# Patient Record
Sex: Female | Born: 2000
Health system: Southern US, Community
[De-identification: ages and names within clinical notes are randomized; demographics above are authoritative.]

## PROBLEM LIST (undated history)

## (undated) HISTORY — PX: DILATION AND CURETTAGE OF UTERUS: SHX78

---

## 2019-09-15 ENCOUNTER — Emergency Department (HOSPITAL_BASED_OUTPATIENT_CLINIC_OR_DEPARTMENT_OTHER)
Admission: EM | Admit: 2019-09-15 | Discharge: 2019-09-15 | Disposition: A | Discharge status: Home / Self Care | Payer: 59 | Attending: Emergency Medicine | Admitting: Emergency Medicine

## 2019-09-15 ENCOUNTER — Emergency Department (HOSPITAL_BASED_OUTPATIENT_CLINIC_OR_DEPARTMENT_OTHER): Payer: 59

## 2019-09-15 ENCOUNTER — Other Ambulatory Visit: Payer: Self-pay

## 2019-09-15 ENCOUNTER — Encounter (HOSPITAL_BASED_OUTPATIENT_CLINIC_OR_DEPARTMENT_OTHER): Payer: Self-pay

## 2019-09-15 DIAGNOSIS — U071 COVID-19: Secondary | ICD-10-CM

## 2019-09-15 DIAGNOSIS — R072 Precordial pain: Secondary | ICD-10-CM | POA: Diagnosis present

## 2019-09-15 LAB — COMPREHENSIVE METABOLIC PANEL
ALT: 11 U/L (ref 0–44)
AST: 20 U/L (ref 15–41)
Albumin: 4 g/dL (ref 3.5–5.0)
Alkaline Phosphatase: 51 U/L (ref 38–126)
Anion gap: 9 (ref 5–15)
BUN: 8 mg/dL (ref 6–20)
CO2: 24 mmol/L (ref 22–32)
Calcium: 8.8 mg/dL — ABNORMAL LOW (ref 8.9–10.3)
Chloride: 105 mmol/L (ref 98–111)
Creatinine, Ser: 0.88 mg/dL (ref 0.44–1.00)
GFR calc Af Amer: >60 mL/min (ref >60)
GFR calc non Af Amer: >60 mL/min (ref >60)
Glucose, Bld: 80 mg/dL (ref 70–99)
Potassium: 3.7 mmol/L (ref 3.5–5.1)
Sodium: 138 mmol/L (ref 135–145)
Total Bilirubin: 0.5 mg/dL (ref 0.3–1.2)
Total Protein: 7.7 g/dL (ref 6.5–8.1)

## 2019-09-15 LAB — CBC
HCT: 41.8 % (ref 36.0–46.0)
Hemoglobin: 13.6 g/dL (ref 12.0–15.0)
MCH: 32.2 pg (ref 26.0–34.0)
MCHC: 32.5 g/dL (ref 30.0–36.0)
MCV: 98.8 fL (ref 80.0–100.0)
Platelets: 223 10*3/uL (ref 150–400)
RBC: 4.23 MIL/uL (ref 3.87–5.11)
RDW: 12 % (ref 11.5–15.5)
WBC: 4.3 10*3/uL (ref 4.0–10.5)
nRBC: 0 % (ref 0.0–0.2)

## 2019-09-15 LAB — LIPASE, BLOOD: Lipase: 29 U/L (ref 11–51)

## 2019-09-15 MED ORDER — DEXAMETHASONE SODIUM PHOSPHATE 10 MG/ML IJ SOLN
10.0000 mg | Freq: Once | INTRAMUSCULAR | Status: AC
  Administered 2019-09-15: 12:00:00 10 mg via INTRAVENOUS
  Filled 2019-09-15: qty 1

## 2019-09-15 MED ORDER — METHYLPREDNISOLONE 4 MG PO TBPK: ORAL_TABLET | ORAL | 0 refills | Status: DC

## 2019-09-15 MED ORDER — ONDANSETRON HCL 4 MG/2ML IJ SOLN
4.0000 mg | Freq: Once | INTRAMUSCULAR | Status: AC
  Administered 2019-09-15: 12:00:00 4 mg via INTRAVENOUS
  Filled 2019-09-15: qty 2

## 2019-09-15 MED ORDER — SODIUM CHLORIDE 0.9 % IV BOLUS
500.0000 mL | Freq: Once | INTRAVENOUS | Status: AC
  Administered 2019-09-15: 500 mL via INTRAVENOUS

## 2019-09-15 MED ORDER — ACETAMINOPHEN 325 MG PO TABS
650.0000 mg | ORAL_TABLET | Freq: Once | ORAL | Status: AC
  Administered 2019-09-15: 12:00:00 650 mg via ORAL
  Filled 2019-09-15: qty 2

## 2019-09-15 MED FILL — METHYLPREDNISOLONE 4 MG TBP: 4 | 6 days supply | Qty: 21 | Fill #0

## 2019-09-15 NOTE — ED Notes (Signed)
Pt reports taking 650 mg Tylenol around 630 this morning.

## 2019-09-15 NOTE — ED Provider Notes (Signed)
[facility] EMERGENCY DEPARTMENT Provider Note   CSN: [id number] Arrival date & time: 09/15/19  9562     History Chief Complaint  Patient presents with  . Chest Pain    Kaityln Kallstrom is a 19 y.o. female who presents emergency department with chief complaint of chest pain.  She is Covid positive.  Onset of symptoms 2 nights ago.  She tested positive yesterday.  She has had associated nausea, body aches, chills, cough and fevers.  She has been taking Tylenol and naproxen which she took early this morning around 6 AM.  She denies vomiting.  This morning she began having pain which is retrosternal, worse with deep breathing and coughing.  She denies unilateral leg swelling, history of PE or DVT.  She is not on oral contraceptive pills.  HPI     History reviewed. No pertinent past medical history.  There are no problems to display for this patient.   History reviewed. No pertinent surgical history.   OB History   No obstetric history on file.     No family history on file.  Social History   Tobacco Use  . Smoking status: Never Smoker  . Smokeless tobacco: Never Used  Substance Use Topics  . Alcohol use: Never  . Drug use: Never    Home Medications Prior to Admission medications   Not on File    Allergies    Shellfish allergy  Review of Systems   Review of Systems Ten systems reviewed and are negative for acute change, except as noted in the HPI.   Physical Exam Updated Vital Signs BP 124/62 (BP Location: Right Arm)   Pulse 94   Temp (!) 101 F (38.3 C) (Oral)   Resp 18   Ht 5\' 7"  (1.702 m)   Wt 59 kg   LMP 09/08/2019   SpO2 100%   BMI 20.36 kg/m   Physical Exam Vitals and nursing note reviewed.  Constitutional:      General: She is not in acute distress.    Appearance: She is well-developed. She is ill-appearing. She is not toxic-appearing or diaphoretic.  HENT:     Head: Normocephalic and atraumatic.  Eyes:     General: No scleral  icterus.    Conjunctiva/sclera: Conjunctivae normal.  Cardiovascular:     Rate and Rhythm: Regular rhythm. Tachycardia present.     Heart sounds: Normal heart sounds. No murmur. No friction rub. No gallop.   Pulmonary:     Effort: Pulmonary effort is normal. No respiratory distress.     Breath sounds: Normal breath sounds.  Abdominal:     General: Bowel sounds are normal. There is no distension.     Palpations: Abdomen is soft. There is no mass.     Tenderness: There is no abdominal tenderness. There is no guarding.  Musculoskeletal:     Cervical back: Normal range of motion.  Skin:    General: Skin is warm and dry.  Neurological:     Mental Status: She is alert and oriented to person, place, and time.  Psychiatric:        Behavior: Behavior normal.     ED Results / Procedures / Treatments   Labs (all labs ordered are listed, but only abnormal results are displayed) Labs Reviewed  CBC  COMPREHENSIVE METABOLIC PANEL  URINALYSIS, ROUTINE W REFLEX MICROSCOPIC  PREGNANCY, URINE  LIPASE, BLOOD    EKG EKG Interpretation  Date/Time:  Tuesday September 15 2019 10:24:52 EDT Ventricular Rate:  104  PR Interval:    QRS Duration: 66 QT Interval:  296 QTC Calculation: 390 R Axis:   77 Text Interpretation: Sinus tachycardia No previous tracing Confirmed by Blanchie Dessert [phone]) on 09/15/2019 10:51:13 AM   Radiology No results found.  Procedures Procedures (including critical care time)  Medications Ordered in ED Medications  ondansetron (ZOFRAN) injection 4 mg (has no administration in time range)  acetaminophen (TYLENOL) tablet 650 mg (has no administration in time range)  dexamethasone (DECADRON) injection 10 mg (has no administration in time range)  sodium chloride 0.9 % bolus 500 mL (has no administration in time range)    ED Course  I have reviewed the triage vital signs and the nursing notes.  Pertinent labs & imaging results that were available during my care of  the patient were reviewed by me and considered in my medical decision making (see chart for details).  Clinical Course as of Sep 15 1827  Tue Sep 15, 2019  1254 Chest pain resolved.    [AH]    Clinical Course User Index [AH] Margarita Mail, PA-C   MDM Rules/Calculators/A&P                      19 year old female with diagnosis of coronavirus. She presented febrile and tachycardic with complaint of chest pain. Did not feel that a D-dimer would be an appropriate diagnostic agent with concern for pulmonary embolus given the fact that the majority of these types of inflammatory markers are elevated in Covid positive patients. I opted to treat the patient with fluid, Decadron, and Tylenol. The patient's vital signs have improved significantly. Her chest pain has resolved. She still has some mild body aches. I personally ordered and reviewed the patient's labs which show normal lipase, CBC without abnormality. CMP shows a slightly low calcium of insignificant value. I also ordered interpreted and reviewed the images of the patient's 1 view chest x-ray which shows no evidence of diffuse patchy infiltrate or signs of Covid related pneumonia. I suspect the patient's symptoms are related to her viral infection. I have very low suspicion for acute pulmonary embolus especially since the patient's pain has completely resolved. Patient appears otherwise appropriate for discharge at this time. I discussed return precautions. Final Clinical Impression(s) / ED Diagnoses Final diagnoses:  None    Rx / DC Orders ED Discharge Orders    None       Margarita Mail, PA-C 09/15/19 1831    Blanchie Dessert, MD 09/17/19 2126

## 2019-09-15 NOTE — Discharge Instructions (Addendum)
Person Under Monitoring Name: Martha Parker  Location: [address] [place] [address]   Infection Prevention Recommendations for Individuals Confirmed to have, or Being Evaluated for, 2019 Novel Coronavirus (COVID-19) Infection Who Receive Care at Home  Individuals who are confirmed to have, or are being evaluated for, COVID-19 should follow the prevention steps below until a healthcare provider or local or state health department says they can return to normal activities.  Stay home except to get medical care You should restrict activities outside your home, except for getting medical care. Do not go to work, school, or public areas, and do not use public transportation or taxis.  Call ahead before visiting your doctor Before your medical appointment, call the healthcare provider and tell them that you have, or are being evaluated for, COVID-19 infection. This will help the healthcare provider's office take steps to keep other people from getting infected. Ask your healthcare provider to call the local or state health department.  Monitor your symptoms Seek prompt medical attention if your illness is worsening (e.g., difficulty breathing). Before going to your medical appointment, call the healthcare provider and tell them that you have, or are being evaluated for, COVID-19 infection. Ask your healthcare provider to call the local or state health department.  Wear a facemask You should wear a facemask that covers your nose and mouth when you are in the same room with other people and when you visit a healthcare provider. People who live with or visit you should also wear a facemask while they are in the same room with you.  Separate yourself from other people in your home As much as possible, you should stay in a different room from other people in your home. Also, you should use a separate bathroom, if available.  Avoid sharing household items You should not share  dishes, drinking glasses, cups, eating utensils, towels, bedding, or other items with other people in your home. After using these items, you should wash them thoroughly with soap and water.  Cover your coughs and sneezes Cover your mouth and nose with a tissue when you cough or sneeze, or you can cough or sneeze into your sleeve. Throw used tissues in a lined trash can, and immediately wash your hands with soap and water for at least 20 seconds or use an alcohol-based hand rub.  Wash your [employer] your hands often and thoroughly with soap and water for at least 20 seconds. You can use an alcohol-based hand sanitizer if soap and water are not available and if your hands are not visibly dirty. Avoid touching your eyes, nose, and mouth with unwashed hands.   Prevention Steps for Caregivers and Household Members of Individuals Confirmed to have, or Being Evaluated for, COVID-19 Infection Being Cared for in the Home  If you live with, or provide care at home for, a person confirmed to have, or being evaluated for, COVID-19 infection please follow these guidelines to prevent infection:  Follow healthcare provider's instructions Make sure that you understand and can help the patient follow any healthcare provider instructions for all care.  Provide for the patient's basic needs You should help the patient with basic needs in the home and provide support for getting groceries, prescriptions, and other personal needs.  Monitor the patient's symptoms If they are getting sicker, call his or her medical provider and tell them that the patient has, or is being evaluated for, COVID-19 infection. This will help the healthcare provider's office take  steps to keep other people from getting infected. Ask the healthcare provider to call the local or state health department.  Limit the number of people who have contact with the patient If possible, have only one caregiver for the patient. Other  household members should stay in another home or place of residence. If this is not possible, they should stay in another room, or be separated from the patient as much as possible. Use a separate bathroom, if available. Restrict visitors who do not have an essential need to be in the home.  Keep older adults, very young children, and other sick people away from the patient Keep older adults, very young children, and those who have compromised immune systems or chronic health conditions away from the patient. This includes people with chronic heart, lung, or kidney conditions, diabetes, and cancer.  Ensure good ventilation Make sure that shared spaces in the home have good air flow, such as from an air conditioner or an opened window, weather permitting.  Wash your hands often Wash your hands often and thoroughly with soap and water for at least 20 seconds. You can use an alcohol based hand sanitizer if soap and water are not available and if your hands are not visibly dirty. Avoid touching your eyes, nose, and mouth with unwashed hands. Use disposable paper towels to dry your hands. If not available, use dedicated cloth towels and replace them when they become wet.  Wear a facemask and gloves Wear a disposable facemask at all times in the room and gloves when you touch or have contact with the patient's blood, body fluids, and/or secretions or excretions, such as sweat, saliva, sputum, nasal mucus, vomit, urine, or feces.  Ensure the mask fits over your nose and mouth tightly, and do not touch it during use. Throw out disposable facemasks and gloves after using them. Do not reuse. Wash your hands immediately after removing your facemask and gloves. If your personal clothing becomes contaminated, carefully remove clothing and launder. Wash your hands after handling contaminated clothing. Place all used disposable facemasks, gloves, and other waste in a lined container before disposing them with  other household waste. Remove gloves and wash your hands immediately after handling these items.  Do not share dishes, glasses, or other household items with the patient Avoid sharing household items. You should not share dishes, drinking glasses, cups, eating utensils, towels, bedding, or other items with a patient who is confirmed to have, or being evaluated for, COVID-19 infection. After the person uses these items, you should wash them thoroughly with soap and water.  Wash laundry thoroughly Immediately remove and wash clothes or bedding that have blood, body fluids, and/or secretions or excretions, such as sweat, saliva, sputum, nasal mucus, vomit, urine, or feces, on them. Wear gloves when handling laundry from the patient. Read and follow directions on labels of laundry or clothing items and detergent. In general, wash and dry with the warmest temperatures recommended on the label.  Clean all areas the individual has used often Clean all touchable surfaces, such as counters, tabletops, doorknobs, bathroom fixtures, toilets, phones, keyboards, tablets, and bedside tables, every day. Also, clean any surfaces that may have blood, body fluids, and/or secretions or excretions on them. Wear gloves when cleaning surfaces the patient has come in contact with. Use a diluted bleach solution (e.g., dilute bleach with 1 part bleach and 10 parts water) or a household disinfectant with a label that says EPA-registered for coronaviruses. To make a bleach solution  at home, add 1 tablespoon of bleach to 1 quart (4 cups) of water. For a larger supply, add  cup of bleach to 1 gallon (16 cups) of water. Read labels of cleaning products and follow recommendations provided on product labels. Labels contain instructions for safe and effective use of the cleaning product including precautions you should take when applying the product, such as wearing gloves or eye protection and making sure you have good ventilation  during use of the product. Remove gloves and wash hands immediately after cleaning.  Monitor yourself for signs and symptoms of illness Caregivers and household members are considered close contacts, should monitor their health, and will be asked to limit movement outside of the home to the extent possible. Follow the monitoring steps for close contacts listed on the symptom monitoring form.   ? If you have additional questions, contact your local health department or call the epidemiologist on call at [phone] (available 24/7). ? This guidance is subject to change. For the most up-to-date guidance from [facility], please refer to their website: [email or web address]

## 2019-09-15 NOTE — ED Triage Notes (Signed)
Pt states that she started having cold syptoms on Sunday night, tested positive for Covid yesterday. Reports in creased CP and cough today with SOB.

## 2019-12-10 ENCOUNTER — Encounter (HOSPITAL_COMMUNITY): Payer: Self-pay | Admitting: Obstetrics and Gynecology

## 2019-12-10 ENCOUNTER — Other Ambulatory Visit: Payer: Self-pay

## 2019-12-10 ENCOUNTER — Inpatient Hospital Stay (HOSPITAL_COMMUNITY)
Admission: AD | Admit: 2019-12-10 | Discharge: 2019-12-10 | Disposition: A | Discharge status: Home / Self Care | Payer: No Typology Code available for payment source | Attending: Obstetrics and Gynecology | Admitting: Obstetrics and Gynecology

## 2019-12-10 DIAGNOSIS — O219 Vomiting of pregnancy, unspecified: Secondary | ICD-10-CM

## 2019-12-10 DIAGNOSIS — O26892 Other specified pregnancy related conditions, second trimester: Secondary | ICD-10-CM | POA: Insufficient documentation

## 2019-12-10 DIAGNOSIS — Z3A13 13 weeks gestation of pregnancy: Secondary | ICD-10-CM | POA: Diagnosis not present

## 2019-12-10 DIAGNOSIS — R824 Acetonuria: Secondary | ICD-10-CM | POA: Diagnosis not present

## 2019-12-10 DIAGNOSIS — R42 Dizziness and giddiness: Secondary | ICD-10-CM | POA: Insufficient documentation

## 2019-12-10 DIAGNOSIS — R55 Syncope and collapse: Secondary | ICD-10-CM | POA: Insufficient documentation

## 2019-12-10 LAB — URINALYSIS, ROUTINE W REFLEX MICROSCOPIC
Bilirubin Urine: NEGATIVE
Glucose, UA: NEGATIVE mg/dL
Hgb urine dipstick: NEGATIVE
Ketones, ur: 80 mg/dL — AB
Nitrite: NEGATIVE
Protein, ur: NEGATIVE mg/dL
Specific Gravity, Urine: 1.019 (ref 1.005–1.030)
pH: 5 (ref 5.0–8.0)

## 2019-12-10 LAB — CBC
HCT: 45.6 % (ref 36.0–46.0)
Hemoglobin: 15.4 g/dL — ABNORMAL HIGH (ref 12.0–15.0)
MCH: 32.4 pg (ref 26.0–34.0)
MCHC: 33.8 g/dL (ref 30.0–36.0)
MCV: 96 fL (ref 80.0–100.0)
Platelets: 347 10*3/uL (ref 150–400)
RBC: 4.75 MIL/uL (ref 3.87–5.11)
RDW: 11.1 % — ABNORMAL LOW (ref 11.5–15.5)
WBC: 6.3 10*3/uL (ref 4.0–10.5)
nRBC: 0 % (ref 0.0–0.2)

## 2019-12-10 LAB — COMPREHENSIVE METABOLIC PANEL
ALT: 15 U/L (ref 0–44)
AST: 31 U/L (ref 15–41)
Albumin: 4.1 g/dL (ref 3.5–5.0)
Alkaline Phosphatase: 52 U/L (ref 38–126)
Anion gap: 13 (ref 5–15)
BUN: 13 mg/dL (ref 6–20)
CO2: 21 mmol/L — ABNORMAL LOW (ref 22–32)
Calcium: 9.9 mg/dL (ref 8.9–10.3)
Chloride: 99 mmol/L (ref 98–111)
Creatinine, Ser: 1.01 mg/dL — ABNORMAL HIGH (ref 0.44–1.00)
GFR calc Af Amer: >60 mL/min (ref >60)
GFR calc non Af Amer: >60 mL/min (ref >60)
Glucose, Bld: 72 mg/dL (ref 70–99)
Potassium: 4.5 mmol/L (ref 3.5–5.1)
Sodium: 133 mmol/L — ABNORMAL LOW (ref 135–145)
Total Bilirubin: 1.7 mg/dL — ABNORMAL HIGH (ref 0.3–1.2)
Total Protein: 8.1 g/dL (ref 6.5–8.1)

## 2019-12-10 LAB — RAPID URINE DRUG SCREEN, HOSP PERFORMED
Amphetamines: NOT DETECTED
Barbiturates: NOT DETECTED
Benzodiazepines: NOT DETECTED
Cocaine: NOT DETECTED
Opiates: NOT DETECTED
Tetrahydrocannabinol: NOT DETECTED

## 2019-12-10 LAB — POCT PREGNANCY, URINE: Preg Test, Ur: POSITIVE — AB

## 2019-12-10 MED ORDER — PYRIDOXINE HCL 25 MG PO TABS: 25.0000 mg | ORAL_TABLET | Freq: Three times a day (TID) | ORAL | 0 refills | Status: AC

## 2019-12-10 MED ORDER — LACTATED RINGERS IV BOLUS
1000.0000 mL | Freq: Once | INTRAVENOUS | Status: AC
  Administered 2019-12-10: 1000 mL via INTRAVENOUS

## 2019-12-10 MED ORDER — PROMETHAZINE HCL 12.5 MG PO TABS: 12.5000 mg | ORAL_TABLET | Freq: Four times a day (QID) | ORAL | 0 refills | Status: AC | PRN

## 2019-12-10 MED ORDER — M.V.I. ADULT IV INJ
Freq: Once | INTRAVENOUS | Status: AC
  Filled 2019-12-10: qty 10

## 2019-12-10 MED ORDER — DOXYLAMINE SUCCINATE (SLEEP) 25 MG PO TABS: 25.0000 mg | ORAL_TABLET | Freq: Three times a day (TID) | ORAL | 0 refills | Status: AC | PRN

## 2019-12-10 NOTE — Discharge Instructions (Signed)
Follow these instructions at home: Eating and drinking   Avoid the following: ? Drinking fluids with meals. Try not to drink anything during the 30 minutes before and after your meals. ? Drinking more than 1 cup of fluid at a time. ? Eating foods that trigger your symptoms. These may include spicy foods, coffee, high-fat foods, very sweet foods, and acidic foods. ? Skipping meals. Nausea can be more intense on an empty stomach. If you cannot tolerate food, do not force it. Try sucking on ice chips or other frozen items and make up for missed calories later. ? Lying down within 2 hours after eating. ? Being exposed to environmental triggers. These may include food smells, smoky rooms, closed spaces, rooms with strong smells, warm or humid places, overly loud and noisy rooms, and rooms with motion or flickering lights. Try eating meals in a well-ventilated area that is free of strong smells. ? Quick and sudden changes in your movement. ? Taking iron pills and multivitamins that contain iron. If you take prescription iron pills, do not stop taking them unless your health care provider approves. ? Preparing food. The smell of food can spoil your appetite or trigger nausea.  To help relieve your symptoms: ? Listen to your body. Everyone is different and has different preferences. Find what works best for you. ? Eat and drink slowly. ? Eat 5-6 small meals daily instead of 3 large meals. Eating small meals and snacks can help you avoid an empty stomach. ? In the morning, before getting out of bed, eat a couple of crackers to avoid moving around on an empty stomach. ? Try eating starchy foods as these are usually tolerated well. Examples include cereal, toast, bread, potatoes, pasta, rice, and pretzels. ? Include at least 1 serving of protein with your meals and snacks. Protein options include lean meats, poultry, seafood, beans, nuts, nut butters, eggs, cheese, and yogurt. ? Try eating a protein-rich  snack before bed. Examples of a protein-rick snack include cheese and crackers or a peanut butter sandwich made with 1 slice of whole-wheat bread and 1 tsp (5 g) of peanut butter. ? Eat or suck on things that have ginger in them. It may help relieve nausea. Add  tsp ground ginger to hot tea or choose ginger tea. ? Try drinking 100% fruit juice or an electrolyte drink. An electrolyte drink contains sodium, potassium, and chloride. ? Drink fluids that are cold, clear, and carbonated or sour. Examples include lemonade, ginger ale, lemon-lime soda, ice water, and sparkling water. ? Brush your teeth or use a mouth rinse after meals. ? Talk with your health care provider about starting a supplement of vitamin B6. General instructions  Take over-the-counter and prescription medicines only as told by your health care provider.  Follow instructions from your health care provider about eating or drinking restrictions.  Continue to take your prenatal vitamins as told by your health care provider. If you are having trouble taking your prenatal vitamins, talk with your health care provider about different options.  Keep all follow-up and pre-birth (prenatal) visits as told by your health care provider. This is important. Contact a health care provider if:  You have pain in your abdomen.  You have a severe headache.  You have vision problems.  You are losing weight.  You feel weak or dizzy. Get help right away if:  You cannot drink fluids without vomiting.  You vomit blood.  You have constant nausea and vomiting.  You are   very weak.  You faint.  You have a fever and your symptoms suddenly get worse. Summary  Making some changes to your eating habits may help relieve nausea and vomiting.  This condition may be managed with medicine.  If medicines do not help relieve nausea and vomiting, you may need to receive fluids through an IV at the hospital. This information is not intended to  replace advice given to you by your health care provider. Make sure you discuss any questions you have with your health care provider. Document Revised: 06/10/2017 Document Reviewed: 01/18/2016 Elsevier Patient Education  2020 [employer]  

## 2019-12-10 NOTE — MAU Note (Signed)
Has been unable to keep anything down for the last 4 days.  Has no energy, been dizzy.no pain. No bleeding.  Has not been seen anywhere yet with preg, has appt for Tue.

## 2019-12-10 NOTE — MAU Provider Note (Signed)
History     CSN: [id number]  Arrival date and time: 12/10/19 9678   First Provider Initiated Contact with Patient 12/10/19 1022      Chief Complaint  Patient presents with  . Emesis  . Nausea  . Possible Pregnancy  . Dizziness   HPI Georganna Maxson is a 19 y.o. G2P0010 at [redacted]w[redacted]d who presents to MAU with chief complaint of nausea and vomiting. These are new problems, onset about three days ago.  Patient also complaints of dizziness, new onset yesterday. She states her family told her to stop showering because her fatigue and dizziness were so severe that they were worried she would faint in the shower. She denies abdominal pain, vaginal bleeding, dysuria, fever or recent illness. Most recent bowel movement two days ago.  Patient has a New OB appointment with [facility] on Tuesday 07/13  OB History    Gravida  2   Para      Term      Preterm      AB  1   Living        SAB      TAB  1   Ectopic      Multiple      Live Births              History reviewed. No pertinent past medical history.  Past Surgical History:  Procedure Laterality Date  . DILATION AND CURETTAGE OF UTERUS      History reviewed. No pertinent family history.  Social History   Tobacco Use  . Smoking status: Never Smoker  . Smokeless tobacco: Never Used  Substance Use Topics  . Alcohol use: Never  . Drug use: Never    Allergies:  Allergies  Allergen Reactions  . Shellfish Allergy Swelling    Medications Prior to Admission  Medication Sig Dispense Refill Last Dose  . methylPREDNISolone (MEDROL DOSEPAK) 4 MG TBPK tablet Use as directed 21 tablet 0 More than a month at Unknown time    Review of Systems  Constitutional: Positive for fatigue.  Respiratory: Negative for shortness of breath.   Gastrointestinal: Positive for nausea and vomiting. Negative for abdominal pain.  Genitourinary: Negative for vaginal bleeding.  Neurological: Positive for dizziness. Negative for  syncope and weakness.  All other systems reviewed and are negative.  Physical Exam   Blood pressure 123/71, pulse (!) 117, temperature 98.2 F (36.8 C), temperature source Oral, resp. rate 16, height 5\' 8"  (1.727 m), weight 53.8 kg, last menstrual period 09/06/2019, SpO2 99 %.  Physical Exam Vitals and nursing note reviewed. Exam conducted with a chaperone present.  Constitutional:      Appearance: She is ill-appearing.  HENT:     Mouth/Throat:     Mouth: Mucous membranes are dry.  Cardiovascular:     Rate and Rhythm: Tachycardia present.     Heart sounds: Normal heart sounds.  Pulmonary:     Effort: Pulmonary effort is normal.     Breath sounds: Normal breath sounds.  Abdominal:     General: Abdomen is flat. Bowel sounds are normal.     Tenderness: There is no abdominal tenderness.  Genitourinary:    Comments: Deferred due to chief complaint, absence of bleeding or abdominal pain Skin:    General: Skin is warm and dry.     Capillary Refill: Capillary refill takes less than 2 seconds.  Neurological:     General: No focal deficit present.     Mental Status: She is  alert.  Psychiatric:        Mood and Affect: Mood normal.        Thought Content: Thought content normal.        Judgment: Judgment normal.     MAU Course/MDM  Procedures  --Abnormal UA in setting of ketonuriua, dehydration. No urinary symptoms reported by patient. Will send urine culture --Patient endorses feeling "so much better" s/p IV fluids. Tolerating multiple packs of crackers without need for antiemetic prior to discharge  Orders Placed This Encounter  Procedures  . Culture, OB Urine  . Urinalysis, Routine w reflex microscopic  . Rapid urine drug screen (hospital performed)  . CBC  . Comprehensive metabolic panel  . Pregnancy, urine POC  . Insert peripheral IV   Patient Vitals for the past 24 hrs:  BP Temp Temp src Pulse Resp SpO2 Height Weight  12/10/19 1333 (!) 112/58 -- -- 98 17 100 % -- --   12/10/19 1242 (!) 124/51 -- -- 87 -- -- -- --  12/10/19 0948 123/71 98.2 F (36.8 C) Oral (!) 117 16 99 % 5\' 8"  (1.727 m) 53.8 kg   Results for orders placed or performed during the hospital encounter of 12/10/19 (from the past 24 hour(s))  Pregnancy, urine POC     Status: Abnormal   Collection Time: 12/10/19  9:54 AM  Result Value Ref Range   Preg Test, Ur POSITIVE (A) NEGATIVE  Rapid urine drug screen (hospital performed)     Status: None   Collection Time: 12/10/19  9:57 AM  Result Value Ref Range   Opiates NONE DETECTED NONE DETECTED   Cocaine NONE DETECTED NONE DETECTED   Benzodiazepines NONE DETECTED NONE DETECTED   Amphetamines NONE DETECTED NONE DETECTED   Tetrahydrocannabinol NONE DETECTED NONE DETECTED   Barbiturates NONE DETECTED NONE DETECTED  Urinalysis, Routine w reflex microscopic     Status: Abnormal   Collection Time: 12/10/19  9:58 AM  Result Value Ref Range   Color, Urine YELLOW YELLOW   APPearance CLOUDY (A) CLEAR   Specific Gravity, Urine 1.019 1.005 - 1.030   pH 5.0 5.0 - 8.0   Glucose, UA NEGATIVE NEGATIVE mg/dL   Hgb urine dipstick NEGATIVE NEGATIVE   Bilirubin Urine NEGATIVE NEGATIVE   Ketones, ur 80 (A) NEGATIVE mg/dL   Protein, ur NEGATIVE NEGATIVE mg/dL   Nitrite NEGATIVE NEGATIVE   Leukocytes,Ua LARGE (A) NEGATIVE   RBC / HPF 0-5 0 - 5 RBC/hpf   WBC, UA 11-20 0 - 5 WBC/hpf   Bacteria, UA RARE (A) NONE SEEN   Squamous Epithelial / LPF 11-20 0 - 5   Mucus PRESENT    Hyaline Casts, UA PRESENT   CBC     Status: Abnormal   Collection Time: 12/10/19 10:34 AM  Result Value Ref Range   WBC 6.3 4.0 - 10.5 K/uL   RBC 4.75 3.87 - 5.11 MIL/uL   Hemoglobin 15.4 (H) 12.0 - 15.0 g/dL   HCT 02/10/20 36 - 46 %   MCV 96.0 80.0 - 100.0 fL   MCH 32.4 26.0 - 34.0 pg   MCHC 33.8 30.0 - 36.0 g/dL   RDW 00.9 (L) 38.1 - 82.9 %   Platelets 347 150 - 400 K/uL   nRBC 0.0 0.0 - 0.2 %  Comprehensive metabolic panel     Status: Abnormal   Collection Time: 12/10/19  10:34 AM  Result Value Ref Range   Sodium 133 (L) 135 - 145 mmol/L   Potassium 4.5 3.5 -  5.1 mmol/L   Chloride 99 98 - 111 mmol/L   CO2 21 (L) 22 - 32 mmol/L   Glucose, Bld 72 70 - 99 mg/dL   BUN 13 6 - 20 mg/dL   Creatinine, Ser 2.37 (H) 0.44 - 1.00 mg/dL   Calcium 9.9 8.9 - 62.8 mg/dL   Total Protein 8.1 6.5 - 8.1 g/dL   Albumin 4.1 3.5 - 5.0 g/dL   AST 31 15 - 41 U/L   ALT 15 0 - 44 U/L   Alkaline Phosphatase 52 38 - 126 U/L   Total Bilirubin 1.7 (H) 0.3 - 1.2 mg/dL   GFR calc non Af Amer >60 >60 mL/min   GFR calc Af Amer >60 >60 mL/min   Anion gap 13 5 - 15   Meds ordered this encounter  Medications  . lactated ringers bolus 1,000 mL  . multivitamins adult (INFUVITE ADULT) 10 mL in lactated ringers 1,000 mL infusion  . pyridOXINE (VITAMIN B-6) 25 MG tablet    Sig: Take 1 tablet (25 mg total) by mouth every 8 (eight) hours.    Dispense:  30 tablet    Refill:  0    Order Specific Question:   Supervising Provider    Answer:   Mariel Aloe A [[id number]]  . doxylamine, Sleep, (UNISOM) 25 MG tablet    Sig: Take 1 tablet (25 mg total) by mouth every 8 (eight) hours as needed.    Dispense:  30 tablet    Refill:  0    Order Specific Question:   Supervising Provider    Answer:   Mariel Aloe A [[id number]]  . promethazine (PHENERGAN) 12.5 MG tablet    Sig: Take 1 tablet (12.5 mg total) by mouth every 6 (six) hours as needed for nausea or vomiting. For nausea and vomiting not relieved by B6 and Unisom    Dispense:  30 tablet    Refill:  0    Order Specific Question:   Supervising Provider    Answer:   Warden Fillers [[id number]]   Assessment and Plan  --20 y.o. G2P0010 at [redacted]w[redacted]d  --Nausea vomiting in early pregnancy --Ketonuria --New rx antiemetics and diet revision --Discharge home in stable condition  F/U --[facility] New OB appt Tuesday 07/13  Calvert Cantor, CNM 12/10/2019, 2:42 PM

## 2019-12-12 LAB — CULTURE, OB URINE

## 2020-01-08 ENCOUNTER — Encounter (HOSPITAL_COMMUNITY): Payer: Self-pay | Admitting: Obstetrics & Gynecology

## 2020-01-08 ENCOUNTER — Inpatient Hospital Stay (HOSPITAL_COMMUNITY)
Admission: AD | Admit: 2020-01-08 | Discharge: 2020-01-08 | Disposition: A | Discharge status: Home / Self Care | Payer: No Typology Code available for payment source | Attending: Obstetrics & Gynecology | Admitting: Obstetrics & Gynecology

## 2020-01-08 ENCOUNTER — Other Ambulatory Visit: Payer: Self-pay

## 2020-01-08 DIAGNOSIS — K117 Disturbances of salivary secretion: Secondary | ICD-10-CM | POA: Diagnosis not present

## 2020-01-08 DIAGNOSIS — Z3A17 17 weeks gestation of pregnancy: Secondary | ICD-10-CM | POA: Diagnosis not present

## 2020-01-08 DIAGNOSIS — O219 Vomiting of pregnancy, unspecified: Secondary | ICD-10-CM

## 2020-01-08 DIAGNOSIS — O99612 Diseases of the digestive system complicating pregnancy, second trimester: Secondary | ICD-10-CM

## 2020-01-08 LAB — URINALYSIS, ROUTINE W REFLEX MICROSCOPIC
Bilirubin Urine: NEGATIVE
Glucose, UA: NEGATIVE mg/dL
Hgb urine dipstick: NEGATIVE
Ketones, ur: 5 mg/dL — AB
Nitrite: NEGATIVE
Protein, ur: NEGATIVE mg/dL
Specific Gravity, Urine: 1.019 (ref 1.005–1.030)
pH: 6 (ref 5.0–8.0)

## 2020-01-08 MED ORDER — GLYCOPYRROLATE 1 MG PO TABS: 1.0000 mg | ORAL_TABLET | Freq: Three times a day (TID) | ORAL | 0 refills | Status: AC

## 2020-01-08 MED ORDER — ONDANSETRON 4 MG PO TBDP
8.0000 mg | ORAL_TABLET | Freq: Once | ORAL | Status: AC
  Administered 2020-01-08: 8 mg via ORAL
  Filled 2020-01-08: qty 2

## 2020-01-08 MED ORDER — ONDANSETRON 4 MG PO TBDP: 4.0000 mg | ORAL_TABLET | Freq: Three times a day (TID) | ORAL | 0 refills | Status: AC | PRN

## 2020-01-08 NOTE — MAU Provider Note (Signed)
History     CSN: [id number]  Arrival date and time: 01/08/20 8563   First Provider Initiated Contact with Patient 01/08/20 2041      Chief Complaint  Patient presents with  . Dizziness  . Emesis   Martha Parker is a 19 y.o. G2P0010 at [redacted]w[redacted]d who receives care at [facility].  She presents today for Dizziness and Emesis.  She states for the past few days her vomiting has been worse.  She states she usually vomits 1-3x/daily, but over the past couple of days she has thrown up at least 5x/day.  She reports she has been trying to maintain a bland diet, but continues to have vomiting. She reports she has taken B6 and Unisom, but has not taken any in the past few days.  She reports that she was taken promethazine, but was told to slowly wean herself by her primary ob.  Patient denies a history of MJ usage.   Corn, Rice "Nasty Bland" Chicken  Cereal Sprite    OB History    Gravida  2   Para      Term      Preterm      AB  1   Living        SAB      TAB  1   Ectopic      Multiple      Live Births              History reviewed. No pertinent past medical history.  Past Surgical History:  Procedure Laterality Date  . DILATION AND CURETTAGE OF UTERUS      History reviewed. No pertinent family history.  Social History   Tobacco Use  . Smoking status: Never Smoker  . Smokeless tobacco: Never Used  Substance Use Topics  . Alcohol use: Never  . Drug use: Never    Allergies:  Allergies  Allergen Reactions  . Shellfish Allergy Anaphylaxis and Swelling    Facial and throat swelling    Medications Prior to Admission  Medication Sig Dispense Refill Last Dose  . doxylamine, Sleep, (UNISOM) 25 MG tablet Take 1 tablet (25 mg total) by mouth every 8 (eight) hours as needed. 30 tablet 0 01/06/2020  . promethazine (PHENERGAN) 12.5 MG tablet Take 1 tablet (12.5 mg total) by mouth every 6 (six) hours as needed for nausea or vomiting. For nausea and vomiting not  relieved by B6 and Unisom 30 tablet 0   . pyridOXINE (VITAMIN B-6) 25 MG tablet Take 1 tablet (25 mg total) by mouth every 8 (eight) hours. 30 tablet 0 01/06/2020    Review of Systems  Constitutional: Negative for chills and fever.  Respiratory: Negative for cough and shortness of breath.   Gastrointestinal: Positive for nausea and vomiting. Negative for abdominal pain, constipation and diarrhea.  Genitourinary: Negative for difficulty urinating, dysuria and pelvic pain.  Musculoskeletal: Negative for back pain.  Neurological: Positive for dizziness (Off and on all week) and light-headedness (Off and on all week). Negative for headaches.   Physical Exam   Blood pressure 133/61, pulse (!) 113, temperature 98.1 F (36.7 C), temperature source Oral, resp. rate 17, weight 51.3 kg, last menstrual period 09/06/2019.  Physical Exam Constitutional:      General: She is not in acute distress.    Appearance: Normal appearance.  HENT:     Head: Normocephalic and atraumatic.  Eyes:     Conjunctiva/sclera: Conjunctivae normal.  Cardiovascular:     Rate and  Rhythm: Normal rate and regular rhythm.     Heart sounds: Normal heart sounds.  Musculoskeletal:        General: Normal range of motion.  Skin:    General: Skin is warm and dry.  Neurological:     Mental Status: She is alert and oriented to person, place, and time.  Psychiatric:        Mood and Affect: Mood normal.        Thought Content: Thought content normal.        Judgment: Judgment normal.     MAU Course  Procedures Results for orders placed or performed during the hospital encounter of 01/08/20 (from the past 24 hour(s))  Urinalysis, Routine w reflex microscopic Urine, Clean Catch     Status: Abnormal   Collection Time: 01/08/20  7:51 PM  Result Value Ref Range   Color, Urine YELLOW YELLOW   APPearance CLOUDY (A) CLEAR   Specific Gravity, Urine 1.019 1.005 - 1.030   pH 6.0 5.0 - 8.0   Glucose, UA NEGATIVE NEGATIVE mg/dL    Hgb urine dipstick NEGATIVE NEGATIVE   Bilirubin Urine NEGATIVE NEGATIVE   Ketones, ur 5 (A) NEGATIVE mg/dL   Protein, ur NEGATIVE NEGATIVE mg/dL   Nitrite NEGATIVE NEGATIVE   Leukocytes,Ua MODERATE (A) NEGATIVE   RBC / HPF 0-5 0 - 5 RBC/hpf   WBC, UA 21-50 0 - 5 WBC/hpf   Bacteria, UA FEW (A) NONE SEEN   Squamous Epithelial / LPF 21-50 0 - 5   Mucus PRESENT    Hyaline Casts, UA PRESENT     MDM Physical Exam Antiemetic Assessment and Plan  19 year old, G2P0010  SIUP at 17.5 weeks Nausea Ptyalism  -Reviewed POC with patient. -Exam performed.  -Discussed usage of oral antiemetic and will change to IV or suppository if needed. -Patient agreeable and Zofran 8mg  ODT ordered.  -Discussed usage of glycopyrrolate for excessive spitting. Patient agreeable and script to be sent to pharmacy. -Reviewed urine and will send for culture. -Will monitor and reassess.   01/08/2020, 8:41 PM   Reassessment (9:40 PM)  -Patient reports improvement of nausea with Zofran dosing. -Limited script sent to pharmacy on file. -Discussed potential side effects including constipation. -Encouraged continued bland diet to aide in reducing nausea.  -Encouraged to call or return to MAU if symptoms worsen or with the onset of new symptoms. -Discharged to home in improved condition.  03/09/2020 MSN, CNM Advanced Practice Provider[facility]

## 2020-01-08 NOTE — MAU Note (Signed)
Pt reports to MAU stating she is here for dizziness/lightheadedness. Pt states she has vomited 5 times today. No bleeding or LOF. Pt reports some mild cramping earlier today none currently. Pt states she took phenergan is not working.

## 2020-01-10 LAB — CULTURE, OB URINE

## 2020-05-06 ENCOUNTER — Inpatient Hospital Stay (HOSPITAL_BASED_OUTPATIENT_CLINIC_OR_DEPARTMENT_OTHER): Payer: No Typology Code available for payment source

## 2020-05-06 ENCOUNTER — Inpatient Hospital Stay (HOSPITAL_COMMUNITY)
Admission: AD | Admit: 2020-05-06 | Discharge: 2020-05-06 | Disposition: A | Discharge status: Home / Self Care | Payer: No Typology Code available for payment source | Attending: Obstetrics and Gynecology | Admitting: Obstetrics and Gynecology

## 2020-05-06 ENCOUNTER — Encounter (HOSPITAL_COMMUNITY): Payer: Self-pay | Admitting: Obstetrics and Gynecology

## 2020-05-06 ENCOUNTER — Other Ambulatory Visit: Payer: Self-pay

## 2020-05-06 DIAGNOSIS — N888 Other specified noninflammatory disorders of cervix uteri: Secondary | ICD-10-CM | POA: Diagnosis not present

## 2020-05-06 DIAGNOSIS — O4692 Antepartum hemorrhage, unspecified, second trimester: Secondary | ICD-10-CM | POA: Insufficient documentation

## 2020-05-06 DIAGNOSIS — R109 Unspecified abdominal pain: Secondary | ICD-10-CM | POA: Insufficient documentation

## 2020-05-06 DIAGNOSIS — O9A212 Injury, poisoning and certain other consequences of external causes complicating pregnancy, second trimester: Secondary | ICD-10-CM

## 2020-05-06 DIAGNOSIS — O99891 Other specified diseases and conditions complicating pregnancy: Secondary | ICD-10-CM | POA: Diagnosis not present

## 2020-05-06 DIAGNOSIS — Z3A27 27 weeks gestation of pregnancy: Secondary | ICD-10-CM | POA: Diagnosis not present

## 2020-05-06 DIAGNOSIS — W19XXXA Unspecified fall, initial encounter: Secondary | ICD-10-CM | POA: Diagnosis not present

## 2020-05-06 DIAGNOSIS — O328XX0 Maternal care for other malpresentation of fetus, not applicable or unspecified: Secondary | ICD-10-CM

## 2020-05-06 DIAGNOSIS — O26892 Other specified pregnancy related conditions, second trimester: Secondary | ICD-10-CM | POA: Diagnosis not present

## 2020-05-06 DIAGNOSIS — Z79899 Other long term (current) drug therapy: Secondary | ICD-10-CM | POA: Diagnosis not present

## 2020-05-06 LAB — WET PREP, GENITAL
Clue Cells Wet Prep HPF POC: NONE SEEN
Sperm: NONE SEEN
Trich, Wet Prep: NONE SEEN
Yeast Wet Prep HPF POC: NONE SEEN

## 2020-05-06 LAB — URINALYSIS, ROUTINE W REFLEX MICROSCOPIC
Bilirubin Urine: NEGATIVE
Glucose, UA: NEGATIVE mg/dL
Hgb urine dipstick: NEGATIVE
Ketones, ur: NEGATIVE mg/dL
Leukocytes,Ua: NEGATIVE
Nitrite: NEGATIVE
Protein, ur: NEGATIVE mg/dL
Specific Gravity, Urine: 1.015 (ref 1.005–1.030)
pH: 9 — ABNORMAL HIGH (ref 5.0–8.0)

## 2020-05-06 LAB — ABO/RH: ABO/RH(D): O POS

## 2020-05-06 NOTE — MAU Provider Note (Signed)
Chief Complaint:  Vaginal Bleeding and Contractions   First Provider Initiated Contact with Patient 05/06/20 0913      HPI: Martha Parker is a 19 y.o. G2P0010 at [redacted]w[redacted]d who presents to maternity admissions reporting onset of vaginal bleeding when wiping this morning. She reports she fell 3 days ago on the stairs and hit her left side but not her abdomen.  Yesterday she reports feeling more abdominal cramping, which continues today. This morning she was urinating and saw blood and wiped several times and saw blood on the tissue. There is no bleeding in her underwear after this incident.  She is feeling normal fetal movement.    Location: low abdomen Quality: cramping Severity: 5/10 on pain scale Duration: 1 day Timing: intermittent, irregular Modifying factors: none Associated signs and symptoms: vaginal bleeding  HPI  Past Medical History: History reviewed. No pertinent past medical history.  Past obstetric history: OB History  Gravida Para Term Preterm AB Living  2       1    SAB TAB Ectopic Multiple Live Births    1          # Outcome Date GA Lbr Len/2nd Weight Sex Delivery Anes PTL Lv  2 Current           1 TAB 2020            Past Surgical History: Past Surgical History:  Procedure Laterality Date  . DILATION AND CURETTAGE OF UTERUS      Family History: Family History  Problem Relation Age of Onset  . Hypertension Mother     Social History: Social History   Tobacco Use  . Smoking status: Never Smoker  . Smokeless tobacco: Never Used  Vaping Use  . Vaping Use: Never used  Substance Use Topics  . Alcohol use: Never  . Drug use: Never    Allergies:  Allergies  Allergen Reactions  . Shellfish Allergy Anaphylaxis and Swelling    Facial and throat swelling    Meds:  Medications Prior to Admission  Medication Sig Dispense Refill Last Dose  . Prenatal Vit-Fe Fumarate-FA (MULTIVITAMIN-PRENATAL) 27-0.8 MG TABS tablet Take 1 tablet by mouth daily at 12 noon.    05/05/2020 at Martha time  . doxylamine, Sleep, (UNISOM) 25 MG tablet Take 1 tablet (25 mg total) by mouth every 8 (eight) hours as needed. 30 tablet 0 Martha at Martha time  . glycopyrrolate (ROBINUL) 1 MG tablet Take 1 tablet (1 mg total) by mouth 3 (three) times daily. 90 tablet 0 Martha at Martha time  . ondansetron (ZOFRAN ODT) 4 MG disintegrating tablet Take 1 tablet (4 mg total) by mouth every 8 (eight) hours as needed for nausea or vomiting. 20 tablet 0 Martha at Martha time  . promethazine (PHENERGAN) 12.5 MG tablet Take 1 tablet (12.5 mg total) by mouth every 6 (six) hours as needed for nausea or vomiting. For nausea and vomiting not relieved by B6 and Unisom 30 tablet 0 Martha at Martha time  . pyridOXINE (VITAMIN B-6) 25 MG tablet Take 1 tablet (25 mg total) by mouth every 8 (eight) hours. 30 tablet 0 Martha at Martha time    ROS:  Review of Systems  Constitutional: Negative for chills, fatigue and fever.  Eyes: Negative for visual disturbance.  Respiratory: Negative for shortness of breath.   Cardiovascular: Negative for chest pain.  Gastrointestinal: Positive for abdominal pain. Negative for nausea and vomiting.  Genitourinary: Positive for vaginal bleeding. Negative for difficulty urinating, dysuria, flank  pain, pelvic pain, vaginal discharge and vaginal pain.  Neurological: Negative for dizziness and headaches.  Psychiatric/Behavioral: Negative.      I have reviewed patient's Past Medical Hx, Surgical Hx, Family Hx, Social Hx, medications and allergies.   Physical Exam   Patient Vitals for the past 24 hrs:  BP Temp Temp src Pulse Resp SpO2 Height Weight  05/06/20 0901 (!) 121/58 -- -- (!) 101 16 98 % -- --  05/06/20 0838 -- -- -- -- -- -- 5\' 7"  (1.702 m) 65 kg  05/06/20 0837 (!) 118/44 -- -- (!) 112 16 99 % -- --  05/06/20 0834 -- 98.5 F (36.9 C) Oral -- -- -- -- --   Constitutional: Well-developed, well-nourished female in no acute distress.   Cardiovascular: normal rate Respiratory: normal effort GI: Abd soft, non-tender, gravid appropriate for gestational age.  MS: Extremities nontender, no edema, normal ROM Neurologic: Alert and oriented x 4.  GU: Neg CVAT.  PELVIC EXAM: Cervix visually closed, no active bleeding in vault but cervix friable to cotton swab, vaginal walls and external genitalia normal   Dilation: Closed Effacement (%): Thick Cervical Position: Posterior Exam by:: [email or web address], CNM  FHT:  Baseline 145 , moderate variability, accelerations present (10 x 10), no decelerations Contractions: None on toco or to palpation   Labs: Results for orders placed or performed during the hospital encounter of 05/06/20 (from the past 24 hour(s))  Wet prep, genital     Status: Abnormal   Collection Time: 05/06/20  9:13 AM   Specimen: PATH Cytology Cervicovaginal Ancillary Only; Genital  Result Value Ref Range   Yeast Wet Prep HPF POC NONE SEEN NONE SEEN   Trich, Wet Prep NONE SEEN NONE SEEN   Clue Cells Wet Prep HPF POC NONE SEEN NONE SEEN   WBC, Wet Prep HPF POC MANY (A) NONE SEEN   Sperm NONE SEEN   ABO/Rh     Status: None   Collection Time: 05/06/20  9:16 AM  Result Value Ref Range   ABO/RH(D) O POS    No rh immune globuloin      NOT A RH IMMUNE GLOBULIN CANDIDATE, PT RH POSITIVE Performed at [facility], [address] [place], [place]   Urinalysis, Routine w reflex microscopic Urine, Clean Catch     Status: Abnormal   Collection Time: 05/06/20  9:58 AM  Result Value Ref Range   Color, Urine YELLOW YELLOW   APPearance CLEAR CLEAR   Specific Gravity, Urine 1.015 1.005 - 1.030   pH 9.0 (H) 5.0 - 8.0   Glucose, UA NEGATIVE NEGATIVE mg/dL   Hgb urine dipstick NEGATIVE NEGATIVE   Bilirubin Urine NEGATIVE NEGATIVE   Ketones, ur NEGATIVE NEGATIVE mg/dL   Protein, ur NEGATIVE NEGATIVE mg/dL   Nitrite NEGATIVE NEGATIVE   Leukocytes,Ua NEGATIVE NEGATIVE   --/--/O POS (12/03  03-28-2000)  Imaging:  No results found.  MAU Course/MDM: Orders Placed This Encounter  Procedures  . Wet prep, genital  . 2637 MFM OB Limited  . Urinalysis, Routine w reflex microscopic  . ABO/Rh  . Discharge patient    No orders of the defined types were placed in this encounter.    NST reviewed and appropriate for gestational age Bleeding likely from friable cervix, wet prep wnl, GC pending.   [place] does not rule in abruption, placenta is not low lying D/C home with bleeding precautions, return to MAU as needed for emergencies Keep scheduled appts in the office  Assessment: 1. Vaginal bleeding in pregnancy, second trimester   2. Friable cervix   3. [redacted] weeks gestation of pregnancy   4. Traumatic injury during pregnancy in second trimester     Plan: Discharge home Labor precautions and fetal kick counts  Follow-up Information    [facility], [facility] Follow up.   Why: Follow up in 1 week, return to MAU as needed for emergencies. Contact information: [address] [place] [address] [phone]              Allergies as of 05/06/2020      Reactions   Shellfish Allergy Anaphylaxis, Swelling   Facial and throat swelling      Medication List    TAKE these medications   doxylamine (Sleep) 25 MG tablet Commonly known as: UNISOM Take 1 tablet (25 mg total) by mouth every 8 (eight) hours as needed.   glycopyrrolate 1 MG tablet Commonly known as: Robinul Take 1 tablet (1 mg total) by mouth 3 (three) times daily.   multivitamin-prenatal 27-0.8 MG Tabs tablet Take 1 tablet by mouth daily at 12 noon.   ondansetron 4 MG disintegrating tablet Commonly known as: Zofran ODT Take 1 tablet (4 mg total) by mouth every 8 (eight) hours as needed for nausea or vomiting.   promethazine 12.5 MG tablet Commonly known as: PHENERGAN Take 1 tablet (12.5 mg total) by mouth every 6 (six) hours as needed for nausea or vomiting. For nausea and vomiting not relieved by  B6 and Unisom   pyridOXINE 25 MG tablet Commonly known as: VITAMIN B-6 Take 1 tablet (25 mg total) by mouth every 8 (eight) hours.       Sharen Counter Certified Nurse-Midwife 05/06/2020 12:11 PM

## 2020-05-06 NOTE — MAU Note (Signed)
Patient came into MAU at [redacted]w[redacted]d gestation per patient with c/o of vaginal bleeding and cramping/contractions this morning. Patient reports she fell down the stairs on Tuesday night, hit her left side, not directly on abdomen. Patient reports bright red bleeding this morning while using the bathroom this morning. Patient denies LOF, patient is feeling fetal movement.

## 2020-05-09 LAB — GC/CHLAMYDIA PROBE AMP (~~LOC~~) NOT AT ARMC
Chlamydia: POSITIVE — AB
Comment: NEGATIVE
Comment: NORMAL
Neisseria Gonorrhea: NEGATIVE

## 2021-10-07 IMAGING — DX DG CHEST 1V PORT
1 series · 1 of 1 positions shown · non-contrast
Comparison: None.

CLINICAL DATA: Chest pain, shortness of breath, [id number] positive

EXAM:
PORTABLE CHEST 1 VIEW

[chest ap]
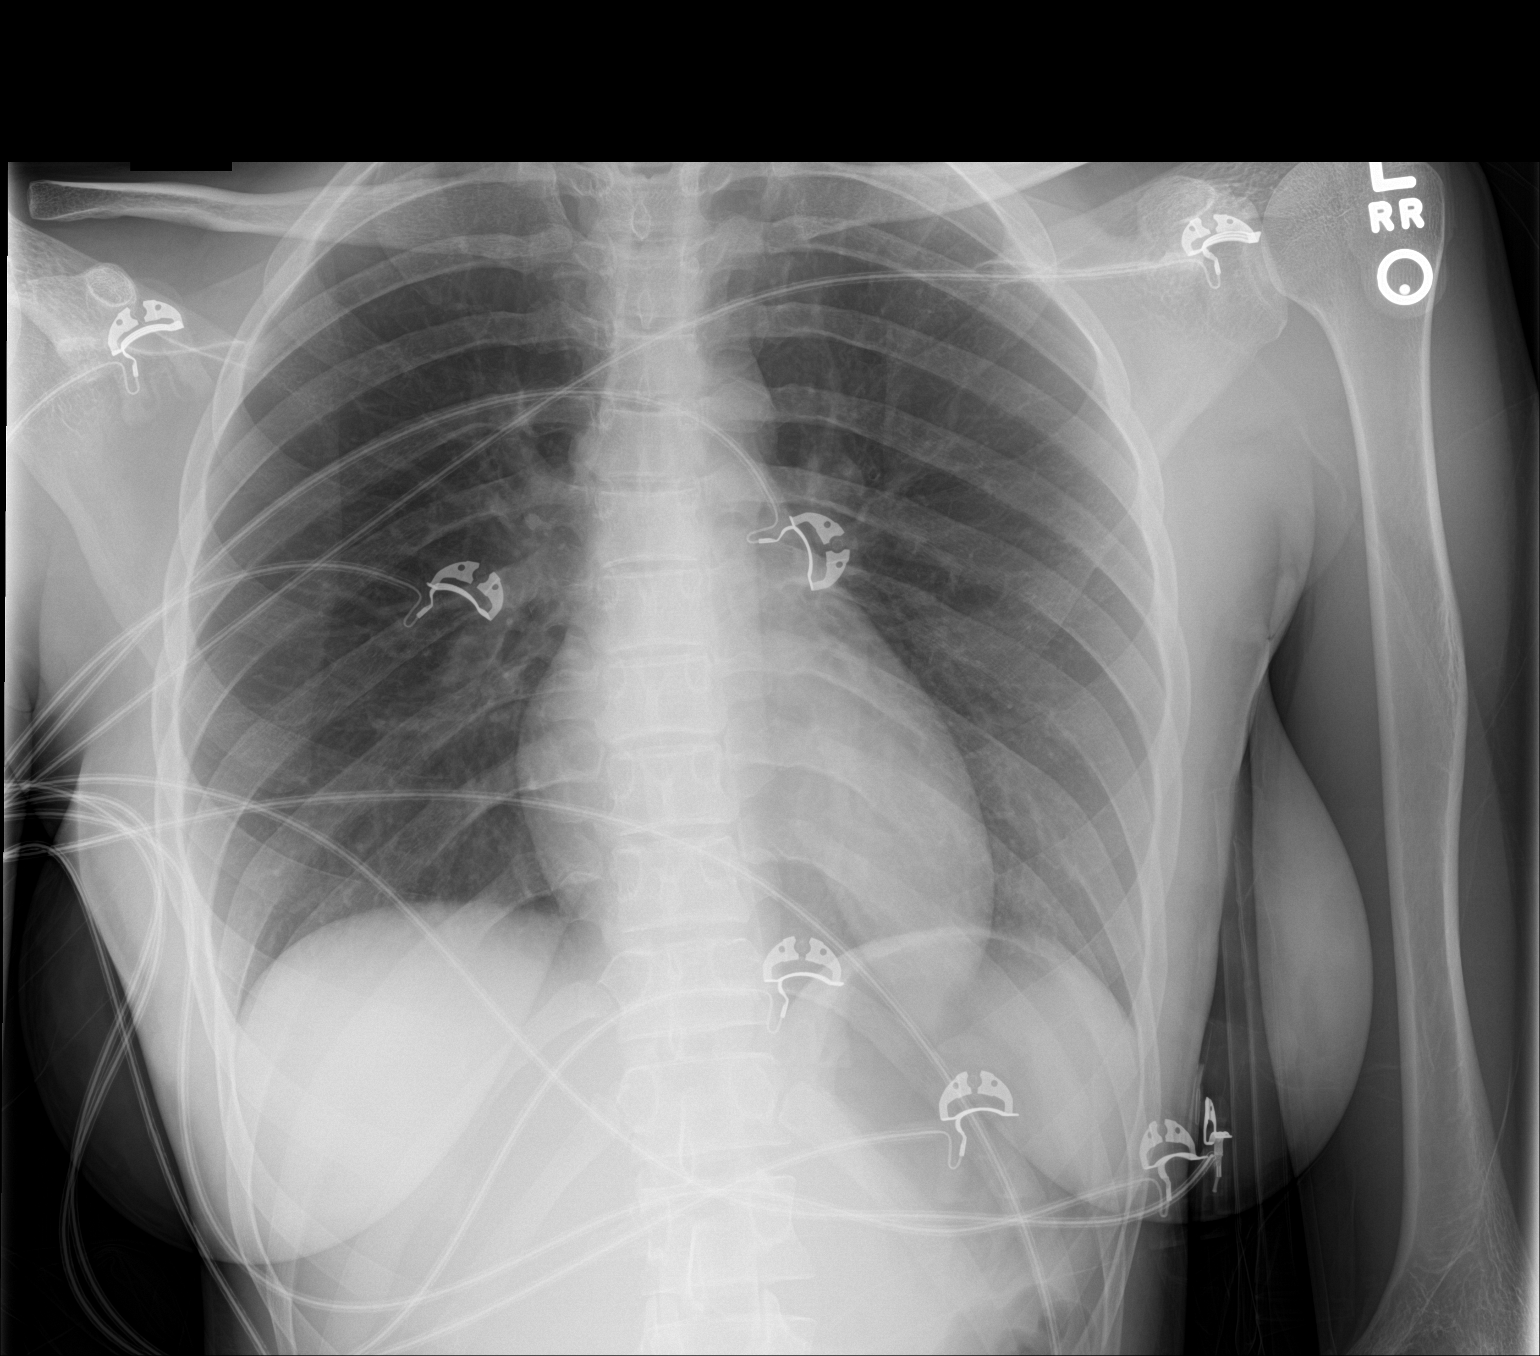

[1 of 1 positions shown; findings below may reference images not displayed]

FINDINGS: The heart size and mediastinal contours are within normal limits. No
focal airspace consolidation, pleural effusion, or pneumothorax. The
visualized skeletal structures are unremarkable.
IMPRESSION: No active disease.
